# Patient Record
Sex: Female | Born: 1995 | Race: Black or African American | Hispanic: No | Marital: Single | Smoking: Never smoker
Health system: Southern US, Community
[De-identification: ages and names within clinical notes are randomized; demographics above are authoritative.]

## PROBLEM LIST (undated history)

## (undated) ENCOUNTER — Emergency Department (HOSPITAL_BASED_OUTPATIENT_CLINIC_OR_DEPARTMENT_OTHER): Admission: EM | Payer: Medicaid Other | Source: Home / Self Care

---
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
## 2017-09-18 ENCOUNTER — Other Ambulatory Visit: Payer: Self-pay

## 2017-09-18 ENCOUNTER — Emergency Department (HOSPITAL_BASED_OUTPATIENT_CLINIC_OR_DEPARTMENT_OTHER)
Admission: EM | Admit: 2017-09-18 | Discharge: 2017-09-18 | Disposition: A | Discharge status: Home / Self Care | Payer: BLUE CROSS/BLUE SHIELD | Attending: Emergency Medicine | Admitting: Emergency Medicine

## 2017-09-18 ENCOUNTER — Encounter (HOSPITAL_BASED_OUTPATIENT_CLINIC_OR_DEPARTMENT_OTHER): Payer: Self-pay | Admitting: Emergency Medicine

## 2017-09-18 DIAGNOSIS — R300 Dysuria: Secondary | ICD-10-CM | POA: Diagnosis present

## 2017-09-18 DIAGNOSIS — Z79899 Other long term (current) drug therapy: Secondary | ICD-10-CM | POA: Diagnosis not present

## 2017-09-18 DIAGNOSIS — N3001 Acute cystitis with hematuria: Secondary | ICD-10-CM | POA: Diagnosis not present

## 2017-09-18 LAB — URINALYSIS, MICROSCOPIC (REFLEX): RBC / HPF: >50 RBC/hpf (ref 0–5)

## 2017-09-18 LAB — URINALYSIS, ROUTINE W REFLEX MICROSCOPIC
Bilirubin Urine: NEGATIVE
Glucose, UA: NEGATIVE mg/dL
KETONES UR: NEGATIVE mg/dL
Nitrite: POSITIVE — AB
PROTEIN: 100 mg/dL — AB
Specific Gravity, Urine: >1.03 — ABNORMAL HIGH (ref 1.005–1.030)
pH: 5.5 (ref 5.0–8.0)

## 2017-09-18 LAB — PREGNANCY, URINE: PREG TEST UR: NEGATIVE

## 2017-09-18 MED ORDER — CEPHALEXIN 500 MG PO CAPS: 500.0000 mg | ORAL_CAPSULE | Freq: Two times a day (BID) | ORAL | 0 refills | Status: DC

## 2017-09-18 NOTE — ED Notes (Signed)
ED Provider at bedside. 

## 2017-09-18 NOTE — ED Provider Notes (Signed)
[facility] EMERGENCY DEPARTMENT Provider Note   CSN: [id number] Arrival date & time: 09/18/17  2024     History   Chief Complaint Chief Complaint  Patient presents with  . Dysuria    HPI Josalin Radi is a 22 y.o. female.  Patient is a 22 year old female with no significant past medical history who presents with possible UTI.  She has a 2-day history of pressure on urination and urgency.  She denies any abdominal pain.  No nausea or vomiting.  No fevers.  No vaginal bleeding or discharge.  She did notice a little bit of blood on the tissue when she wiped this morning.  She had a history of a UTI in the past about 2 years ago and these symptoms are similar to what she had them.  She has a little achiness in her right lower back but no other back pain.  No flank pain.     History reviewed. No pertinent past medical history.  There are no active problems to display for this patient.   History reviewed. No pertinent surgical history.   OB History   None      Home Medications    Prior to Admission medications   Medication Sig Start Date End Date Taking? Authorizing Provider  amoxicillin (AMOXIL) 500 MG capsule Take 1 capsule (500 mg total) by mouth 3 (three) times daily. 05/22/16   Roxy Horseman, PA-C  cephALEXin (KEFLEX) 500 MG capsule Take 1 capsule (500 mg total) by mouth 2 (two) times daily. 09/18/17   Rolan Bucco, MD    Family History History reviewed. No pertinent family history.  Social History Social History   Tobacco Use  . Smoking status: Never Smoker  . Smokeless tobacco: Never Used  Substance Use Topics  . Alcohol use: No  . Drug use: No     Allergies   Patient has no known allergies.   Review of Systems Review of Systems  Constitutional: Negative for chills, diaphoresis, fatigue and fever.  HENT: Negative for congestion, rhinorrhea and sneezing.   Eyes: Negative.   Respiratory: Negative for cough, chest tightness and shortness  of breath.   Cardiovascular: Negative for chest pain and leg swelling.  Gastrointestinal: Negative for abdominal pain, blood in stool, diarrhea, nausea and vomiting.  Genitourinary: Positive for dysuria, frequency and urgency. Negative for difficulty urinating, flank pain and hematuria.  Musculoskeletal: Negative for arthralgias and back pain.  Skin: Negative for rash.  Neurological: Negative for dizziness, speech difficulty, weakness, numbness and headaches.     Physical Exam Updated Vital Signs BP 122/72 (BP Location: Left Arm)   Pulse 72   Temp 98.4 F (36.9 C) (Oral)   Resp 18   Ht 5\' 5"  (1.651 m)   Wt 84.8 kg (187 lb)   LMP 09/04/2017   SpO2 100%   BMI 31.12 kg/m   Physical Exam  Constitutional: She is oriented to person, place, and time. She appears well-developed and well-nourished.  HENT:  Head: Normocephalic and atraumatic.  Eyes: Pupils are equal, round, and reactive to light.  Neck: Normal range of motion. Neck supple.  Cardiovascular: Normal rate, regular rhythm and normal heart sounds.  Pulmonary/Chest: Effort normal and breath sounds normal. No respiratory distress. She has no wheezes. She has no rales. She exhibits no tenderness.  Abdominal: Soft. Bowel sounds are normal. There is no tenderness. There is no rebound and no guarding.  No CVA tenderness  Musculoskeletal: Normal range of motion. She exhibits no edema.  Lymphadenopathy:  She has no cervical adenopathy.  Neurological: She is alert and oriented to person, place, and time.  Skin: Skin is warm and dry. No rash noted.  Psychiatric: She has a normal mood and affect.     ED Treatments / Results  Labs (all labs ordered are listed, but only abnormal results are displayed) Labs Reviewed  URINALYSIS, ROUTINE W REFLEX MICROSCOPIC - Abnormal; Notable for the following components:      Result Value   APPearance CLOUDY (*)    Specific Gravity, Urine >1.030 (*)    Hgb urine dipstick LARGE (*)     Protein, ur 100 (*)    Nitrite POSITIVE (*)    Leukocytes, UA TRACE (*)    All other components within normal limits  URINALYSIS, MICROSCOPIC (REFLEX) - Abnormal; Notable for the following components:   Bacteria, UA MANY (*)    All other components within normal limits  PREGNANCY, URINE    EKG None  Radiology No results found.  Procedures Procedures (including critical care time)  Medications Ordered in ED Medications - No data to display   Initial Impression / Assessment and Plan / ED Course  I have reviewed the triage vital signs and the nursing notes.  Pertinent labs & imaging results that were available during my care of the patient were reviewed by me and considered in my medical decision making (see chart for details).     Patient is a 22 year old female who presents with urinary symptoms.  She does have evidence of a UTI.  She does not appear systemically ill.  There is no symptoms that we more concerning for pyelonephritis or renal colic.  She was started on Keflex.  She was encouraged to follow-up with her PCP if her symptoms are not improving.  Return precautions were given.  Final Clinical Impressions(s) / ED Diagnoses   Final diagnoses:  Acute cystitis with hematuria    ED Discharge Orders        Ordered    cephALEXin (KEFLEX) 500 MG capsule  2 times daily     09/18/17 2140       Rolan BuccoBelfi, Chalisa Kobler, MD 09/18/17 2141

## 2017-09-18 NOTE — ED Triage Notes (Signed)
Patient states that she is having frequency with urination and pain with urination. She reports that she had blood when she wiped this am

## 2021-09-07 ENCOUNTER — Encounter: Payer: Self-pay | Admitting: Emergency Medicine

## 2021-09-07 ENCOUNTER — Ambulatory Visit
Admission: EM | Admit: 2021-09-07 | Discharge: 2021-09-07 | Disposition: A | Discharge status: Home / Self Care | Payer: BC Managed Care – PPO | Attending: Urgent Care | Admitting: Urgent Care

## 2021-09-07 DIAGNOSIS — B354 Tinea corporis: Secondary | ICD-10-CM | POA: Diagnosis not present

## 2021-09-07 MED ORDER — FLUCONAZOLE 150 MG PO TABS: 150.0000 mg | ORAL_TABLET | Freq: Once | ORAL | 0 refills | Status: AC

## 2021-09-07 MED ORDER — KETOCONAZOLE 2 % EX CREA: 1.0000 | TOPICAL_CREAM | Freq: Every day | CUTANEOUS | 0 refills | Status: DC

## 2021-09-07 NOTE — ED Triage Notes (Signed)
Pt here with rash to neck and chest x 1 month. Has tried OTC creams without relief. Itches when dry. Is intermittently tender to touch.

## 2021-09-07 NOTE — ED Provider Notes (Signed)
  [facility]   MRN: [id number] DOB: 03-Jun-1995  Subjective:   Alexis Copeland is a 26 y.o. female presenting for 1 month history of persistent itchy rash over the neck and chest.  Has used over-the-counter creams without much relief.  No drainage of pus or bleeding.  Patient has scratched and she has noticed some breaks of the skin that are painful. Denies eating any new foods, starting new medications, exposure to poisonous plants, new hygiene products, new cleaning products or detergents.   No current facility-administered medications for this encounter.  Current Outpatient Medications:    fluconazole (DIFLUCAN) 150 MG tablet, Take 1 tablet (150 mg total) by mouth once for 1 dose., Disp: 1 tablet, Rfl: 0   ketoconazole (NIZORAL) 2 % cream, Apply 1 Application topically daily., Disp: 60 g, Rfl: 0   No Known Allergies  History reviewed. No pertinent past medical history.   History reviewed. No pertinent surgical history.  History reviewed. No pertinent family history.  Social History   Tobacco Use   Smoking status: Never   Smokeless tobacco: Never  Substance Use Topics   Alcohol use: No   Drug use: No    ROS   Objective:   Vitals: BP 114/73   Pulse 73   Temp 98.9 F (37.2 C)   Resp 20   SpO2 99%   Physical Exam Constitutional:      General: She is not in acute distress.    Appearance: Normal appearance. She is well-developed. She is not ill-appearing, toxic-appearing or diaphoretic.  HENT:     Head: Normocephalic and atraumatic.     Nose: Nose normal.     Mouth/Throat:     Mouth: Mucous membranes are moist.  Eyes:     General: No scleral icterus.       Right eye: No discharge.        Left eye: No discharge.     Extraocular Movements: Extraocular movements intact.  Cardiovascular:     Rate and Rhythm: Normal rate.  Pulmonary:     Effort: Pulmonary effort is normal.  Skin:    General: Skin is warm and dry.     Findings: Rash (dry  scaly irregular shaped patches with central clearing) present.  Neurological:     General: No focal deficit present.     Mental Status: She is alert and oriented to person, place, and time.  Psychiatric:        Mood and Affect: Mood normal.        Behavior: Behavior normal.            Assessment and Plan :   PDMP not reviewed this encounter.  1. Tinea corporis    Start oral fluconazole as a loading dose.  Then used ketoconazole cream to manage tinea corporis. Counseled patient on potential for adverse effects with medications prescribed/recommended today, ER and return-to-clinic precautions discussed, patient verbalized understanding.    Wallis Bamberg, PA-C 09/07/21 1433

## 2021-10-11 ENCOUNTER — Ambulatory Visit
Admission: EM | Admit: 2021-10-11 | Discharge: 2021-10-11 | Disposition: A | Discharge status: Home / Self Care | Payer: BC Managed Care – PPO | Attending: Family Medicine | Admitting: Family Medicine

## 2021-10-11 DIAGNOSIS — N760 Acute vaginitis: Secondary | ICD-10-CM | POA: Diagnosis present

## 2021-10-11 DIAGNOSIS — A5602 Chlamydial vulvovaginitis: Secondary | ICD-10-CM | POA: Insufficient documentation

## 2021-10-11 MED ORDER — METRONIDAZOLE 500 MG PO TABS: 500.0000 mg | ORAL_TABLET | Freq: Two times a day (BID) | ORAL | 0 refills | Status: DC

## 2021-10-11 NOTE — ED Provider Notes (Signed)
[facility]    CSN: [id number] Arrival date & time: 10/11/21  1504      History   Chief Complaint Chief Complaint  Patient presents with   SEXUALLY TRANSMITTED DISEASE    Believe I may have BV Or trich - Entered by patient    HPI Alexis Copeland is a 26 y.o. female.   Presenting today with 2-week history of white/gray vaginal discharge, fishy odor.  She denies rashes or lesions, pelvic or abdominal pain, nausea, vomiting, diarrhea, dysuria, hematuria.  States she has had BV before and that this feels similar.  No known exposures to any STIs.  Not concern for pregnancy, states LMP was about 3 weeks ago.  Not trying anything over-the-counter for symptoms.    History reviewed. No pertinent past medical history.  There are no problems to display for this patient.   History reviewed. No pertinent surgical history.  OB History   No obstetric history on file.      Home Medications    Prior to Admission medications   Medication Sig Start Date End Date Taking? Authorizing Provider  metroNIDAZOLE (FLAGYL) 500 MG tablet Take 1 tablet (500 mg total) by mouth 2 (two) times daily. 10/11/21  Yes Particia Nearing, PA-C  ketoconazole (NIZORAL) 2 % cream Apply 1 Application topically daily. 09/07/21   Wallis Bamberg, PA-C    Family History History reviewed. No pertinent family history.  Social History Social History   Tobacco Use   Smoking status: Never   Smokeless tobacco: Never  Substance Use Topics   Alcohol use: No   Drug use: No     Allergies   Patient has no known allergies.   Review of Systems Review of Systems Per HPI  Physical Exam Triage Vital Signs ED Triage Vitals [10/11/21 1517]  Enc Vitals Group     BP 124/85     Pulse Rate 96     Resp      Temp      Temp Source Oral     SpO2 98 %     Weight      Height      Head Circumference      Peak Flow      Pain Score      Pain Loc      Pain Edu?      Excl. in GC?    No data  found.  Updated Vital Signs BP 124/85 (BP Location: Left Arm)   Pulse 96   SpO2 98%   Visual Acuity Right Eye Distance:   Left Eye Distance:   Bilateral Distance:    Right Eye Near:   Left Eye Near:    Bilateral Near:     Physical Exam Vitals and nursing note reviewed.  Constitutional:      Appearance: Normal appearance. She is not ill-appearing.  HENT:     Head: Atraumatic.  Eyes:     Extraocular Movements: Extraocular movements intact.     Conjunctiva/sclera: Conjunctivae normal.  Cardiovascular:     Rate and Rhythm: Normal rate and regular rhythm.     Heart sounds: Normal heart sounds.  Pulmonary:     Effort: Pulmonary effort is normal.     Breath sounds: Normal breath sounds.  Genitourinary:    Comments: GU exam deferred, self swab performed Musculoskeletal:        General: Normal range of motion.     Cervical back: Normal range of motion and neck supple.  Skin:  General: Skin is warm and dry.  Neurological:     Mental Status: She is alert and oriented to person, place, and time.  Psychiatric:        Mood and Affect: Mood normal.        Thought Content: Thought content normal.        Judgment: Judgment normal.      UC Treatments / Results  Labs (all labs ordered are listed, but only abnormal results are displayed) Labs Reviewed  CERVICOVAGINAL ANCILLARY ONLY    EKG   Radiology No results found.  Procedures Procedures (including critical care time)  Medications Ordered in UC Medications - No data to display  Initial Impression / Assessment and Plan / UC Course  I have reviewed the triage vital signs and the nursing notes.  Pertinent labs & imaging results that were available during my care of the patient were reviewed by me and considered in my medical decision making (see chart for details).     Vaginal swab pending, given symptoms and history of BV will cover with metronidazole while awaiting these results.  Boric acid suppositories,  good vaginal hygiene reviewed.  Adjust treatment based on results.  Final Clinical Impressions(s) / UC Diagnoses   Final diagnoses:  Acute vaginitis   Discharge Instructions   None    ED Prescriptions     Medication Sig Dispense Auth. Provider   metroNIDAZOLE (FLAGYL) 500 MG tablet Take 1 tablet (500 mg total) by mouth 2 (two) times daily. 14 tablet Particia Nearing, [place]      PDMP not reviewed this encounter.   Particia Nearing, [place] 10/11/21 1544

## 2021-10-11 NOTE — ED Notes (Signed)
Std Testing, Vaginal Discharge that's discolored, Odor, Urinary Frequency  x 2 weeks   Pain 0/10   LMP-09/12/21 

## 2021-10-11 NOTE — ED Triage Notes (Signed)
Std Testing, Vaginal Discharge that's discolored, Odor, Urinary Frequency  x 2 weeks   Pain 0/10   LMP-09/12/21

## 2021-10-13 ENCOUNTER — Telehealth (HOSPITAL_COMMUNITY): Payer: Self-pay | Admitting: Emergency Medicine

## 2021-10-13 LAB — CERVICOVAGINAL ANCILLARY ONLY
Bacterial Vaginitis (gardnerella): POSITIVE — AB
Candida Glabrata: NEGATIVE
Candida Vaginitis: NEGATIVE
Chlamydia: POSITIVE — AB
Comment: NEGATIVE
Comment: NEGATIVE
Comment: NEGATIVE
Comment: NEGATIVE
Comment: NEGATIVE
Comment: NORMAL
Neisseria Gonorrhea: NEGATIVE
Trichomonas: NEGATIVE

## 2021-10-13 MED ORDER — DOXYCYCLINE HYCLATE 100 MG PO CAPS: 100.0000 mg | ORAL_CAPSULE | Freq: Two times a day (BID) | ORAL | 0 refills | Status: AC

## 2021-11-11 ENCOUNTER — Ambulatory Visit
Admission: EM | Admit: 2021-11-11 | Discharge: 2021-11-11 | Disposition: A | Discharge status: Home / Self Care | Payer: BC Managed Care – PPO | Attending: Urgent Care | Admitting: Urgent Care

## 2021-11-11 ENCOUNTER — Encounter: Payer: Self-pay | Admitting: Emergency Medicine

## 2021-11-11 DIAGNOSIS — R070 Pain in throat: Secondary | ICD-10-CM | POA: Diagnosis not present

## 2021-11-11 DIAGNOSIS — R0982 Postnasal drip: Secondary | ICD-10-CM

## 2021-11-11 DIAGNOSIS — H9203 Otalgia, bilateral: Secondary | ICD-10-CM | POA: Diagnosis not present

## 2021-11-11 DIAGNOSIS — J069 Acute upper respiratory infection, unspecified: Secondary | ICD-10-CM

## 2021-11-11 MED ORDER — PROMETHAZINE-DM 6.25-15 MG/5ML PO SYRP: 2.5000 mL | ORAL_SOLUTION | Freq: Every evening | ORAL | 0 refills | Status: DC | PRN

## 2021-11-11 MED ORDER — LEVOCETIRIZINE DIHYDROCHLORIDE 5 MG PO TABS: 5.0000 mg | ORAL_TABLET | Freq: Every evening | ORAL | 0 refills | Status: DC

## 2021-11-11 MED ORDER — PSEUDOEPHEDRINE HCL 60 MG PO TABS: 60.0000 mg | ORAL_TABLET | Freq: Three times a day (TID) | ORAL | 0 refills | Status: DC | PRN

## 2021-11-11 NOTE — Discharge Instructions (Addendum)
We will manage this as a viral illness. For sore throat or cough try using a honey-based tea. Use 3 teaspoons of honey with juice squeezed from half lemon. Place shaved pieces of ginger into 1/2-1 cup of water and warm over stove top. Then mix the ingredients and repeat every 4 hours as needed. Please take ibuprofen 600mg  every 6 hours with food alternating with OR taken together with Tylenol 500mg -650mg  every 6 hours for throat pain, fevers, aches and pains. Hydrate very well with at least 2 liters of water. Eat light meals such as soups (chicken and noodles, vegetable, chicken and wild rice).  Do not eat foods that you are allergic to.  Taking an antihistamine like Xyzal can help against postnasal drainage, sinus congestion which can cause sinus pain, sinus headaches, throat pain, painful swallowing, coughing.  You can take this together with pseudoephedrine (Sudafed) at a dose of 30-60 mg 3 times a day or twice daily as needed for the same kind of nasal drip, congestion.  Use cough medications as needed.

## 2021-11-11 NOTE — ED Triage Notes (Signed)
Pt here with bodyaches, throat pain, and otalgia x 4 days.

## 2021-11-11 NOTE — ED Provider Notes (Signed)
[facility]   MRN: [id number] DOB: [birth year]  Subjective:   Alexis Copeland is a 26 y.o. female presenting for 4 day history of bilateral ear fullness and pain, left worse than right, throat pain, body aches, slight cough.  No chest pain, shortness of breath or wheezing.  Patient did 2 COVID test at home and both were negative.  Has had some sick contacts at work, no specifics just knows that a lot of people are coughing.  No history of respiratory disorders.  No drug use, alcohol use.  Patient is not a smoker.  No current facility-administered medications for this encounter.  Current Outpatient Medications:    ketoconazole (NIZORAL) 2 % cream, Apply 1 Application topically daily., Disp: 60 g, Rfl: 0   metroNIDAZOLE (FLAGYL) 500 MG tablet, Take 1 tablet (500 mg total) by mouth 2 (two) times daily., Disp: 14 tablet, Rfl: 0   No Known Allergies  History reviewed. No pertinent past medical history.   History reviewed. No pertinent surgical history.  History reviewed. No pertinent family history.  Social History   Tobacco Use   Smoking status: Never   Smokeless tobacco: Never  Substance Use Topics   Alcohol use: No   Drug use: No    ROS   Objective:   Vitals: BP 112/79   Pulse 97   Temp 99.6 F (37.6 C) (Oral)   Resp 20   SpO2 98%   Physical Exam Constitutional:      General: She is not in acute distress.    Appearance: Normal appearance. She is well-developed and normal weight. She is not ill-appearing, toxic-appearing or diaphoretic.  HENT:     Head: Normocephalic and atraumatic.     Right Ear: Tympanic membrane, ear canal and external ear normal. No drainage, swelling or tenderness. No middle ear effusion. There is no impacted cerumen. Tympanic membrane is not erythematous.     Left Ear: Tympanic membrane, ear canal and external ear normal. No drainage, swelling or tenderness.  No middle ear effusion. There is no impacted cerumen. Tympanic  membrane is not erythematous.     Nose: Nose normal. No congestion or rhinorrhea.     Mouth/Throat:     Mouth: Mucous membranes are moist. No oral lesions.     Pharynx: No pharyngeal swelling, oropharyngeal exudate, posterior oropharyngeal erythema or uvula swelling.     Tonsils: No tonsillar exudate or tonsillar abscesses. 0 on the right. 0 on the left.  Eyes:     General: No scleral icterus.       Right eye: No discharge.        Left eye: No discharge.     Extraocular Movements: Extraocular movements intact.     Right eye: Normal extraocular motion.     Left eye: Normal extraocular motion.     Conjunctiva/sclera: Conjunctivae normal.  Cardiovascular:     Rate and Rhythm: Normal rate and regular rhythm.     Heart sounds: Normal heart sounds. No murmur heard.    No friction rub. No gallop.  Pulmonary:     Effort: Pulmonary effort is normal. No respiratory distress.     Breath sounds: No stridor. No wheezing, rhonchi or rales.  Chest:     Chest wall: No tenderness.  Musculoskeletal:     Cervical back: Normal range of motion and neck supple.  Lymphadenopathy:     Cervical: No cervical adenopathy.  Skin:    General: Skin is warm and dry.  Neurological:  General: No focal deficit present.     Mental Status: She is alert and oriented to person, place, and time.  Psychiatric:        Mood and Affect: Mood normal.        Behavior: Behavior normal.     Assessment and Plan :   PDMP not reviewed this encounter.  1. Viral upper respiratory infection   2. Throat pain   3. Ear pain, bilateral   4. Post-nasal drainage    Deferred imaging given clear cardiopulmonary exam, hemodynamically stable vital signs. Does not meet [facility] criteria for strep testing.  Suspect viral URI, viral syndrome. Physical exam findings reassuring and vital signs stable for discharge. Advised supportive care, offered symptomatic relief. Counseled patient on potential for adverse effects with medications  prescribed/recommended today, ER and return-to-clinic precautions discussed, patient verbalized understanding.     Wallis Bamberg, PA-C 11/11/21 1235

## 2021-11-19 ENCOUNTER — Ambulatory Visit
Admission: EM | Admit: 2021-11-19 | Discharge: 2021-11-19 | Disposition: A | Discharge status: Home / Self Care | Payer: BC Managed Care – PPO | Attending: Emergency Medicine | Admitting: Emergency Medicine

## 2021-11-19 DIAGNOSIS — J309 Allergic rhinitis, unspecified: Secondary | ICD-10-CM

## 2021-11-19 MED ORDER — LEVOCETIRIZINE DIHYDROCHLORIDE 5 MG PO TABS: 5.0000 mg | ORAL_TABLET | Freq: Every evening | ORAL | 1 refills | Status: AC

## 2021-11-19 MED ORDER — FLUTICASONE PROPIONATE 50 MCG/ACT NA SUSP: 1.0000 | Freq: Every day | NASAL | 2 refills | Status: AC

## 2021-11-19 NOTE — ED Triage Notes (Signed)
The patient c/o sore throat and left eat pain that has not gotten better since last visit. The patient states she has been taking motrin.

## 2021-11-19 NOTE — ED Provider Notes (Signed)
[facility]    CSN: [id number] Arrival date & time: 11/19/21  1928    HISTORY   Chief Complaint  Patient presents with   Otalgia   Sore Throat   HPI Alexis Copeland is a pleasant, 26 y.o. female who presents to urgent care today. The patient c/o sore throat and left eat pain that has not gotten better since last visit. The patient states she has been taking Xyzal and motrin without meaningful relief of her symptoms.  Patient states that the Sudafed and Promethazine DM she was prescribed have not been helpful at all, states she when she was seen last week here at urgent care she was not coughing, is not sure why she was given cough syrup.  The history is provided by the patient.   History reviewed. No pertinent past medical history. There are no problems to display for this patient.  History reviewed. No pertinent surgical history. OB History   No obstetric history on file.    Home Medications    Prior to Admission medications   Medication Sig Start Date End Date Taking? Authorizing Provider  levocetirizine (XYZAL) 5 MG tablet Take 1 tablet (5 mg total) by mouth every evening. 11/11/21   Wallis Bamberg, PA-C    Family History History reviewed. No pertinent family history. Social History Social History   Tobacco Use   Smoking status: Never   Smokeless tobacco: Never  Substance Use Topics   Alcohol use: No   Drug use: No   Allergies   Patient has no known allergies.  Review of Systems Review of Systems Pertinent findings revealed after performing a 14 point review of systems has been noted in the history of present illness.  Physical Exam Triage Vital Signs ED Triage Vitals  Enc Vitals Group     BP 01/16/21 0827 (!) 147/82     Pulse Rate 01/16/21 0827 72     Resp 01/16/21 0827 18     Temp 01/16/21 0827 98.3 F (36.8 C)     Temp Source 01/16/21 0827 Oral     SpO2 01/16/21 0827 98 %     Weight --      Height --      Head Circumference --      Peak  Flow --      Pain Score 01/16/21 0826 5     Pain Loc --      Pain Edu? --      Excl. in [place]? --   No data found.  Updated Vital Signs BP 123/87 (BP Location: Right Arm)   Pulse 86   Temp 98.6 F (37 C) (Oral)   Resp 16   LMP 11/11/2021 (Approximate)   SpO2 98%   Physical Exam HENT:     Right Ear: External ear normal. A middle ear effusion is present. Tympanic membrane is bulging. Tympanic membrane is not injected or erythematous.     Left Ear: External ear normal. A middle ear effusion is present. Tympanic membrane is bulging. Tympanic membrane is not injected or erythematous.     Ears:     Comments: Bilateral EACs normal, both TMs bulging with clear fluid    Nose: Rhinorrhea present. No nasal deformity, septal deviation, signs of injury or nasal tenderness. Rhinorrhea is clear.     Right Nostril: Occlusion present. No foreign body, epistaxis or septal hematoma.     Left Nostril: Occlusion present. No foreign body, epistaxis or septal hematoma.     Right Turbinates: Enlarged, swollen  and pale.     Left Turbinates: Enlarged, swollen and pale.     Right Sinus: No maxillary sinus tenderness or frontal sinus tenderness.     Left Sinus: No maxillary sinus tenderness or frontal sinus tenderness.     Mouth/Throat:     Comments: Postnasal drip    Visual Acuity Right Eye Distance:   Left Eye Distance:   Bilateral Distance:    Right Eye Near:   Left Eye Near:    Bilateral Near:     UC Couse / Diagnostics / Procedures:     Radiology No results found.  Procedures Procedures (including critical care time) EKG  Pending results:  Labs Reviewed - No data to display  Medications Ordered in UC: Medications - No data to display  UC Diagnoses / Final Clinical Impressions(s)   I have reviewed the triage vital signs and the nursing notes.  Pertinent labs & imaging results that were available during my care of the patient were reviewed by me and considered in my medical decision  making (see chart for details).    Final diagnoses:  Allergic rhinitis, unspecified seasonality, unspecified trigger   Physical exam findings not concerning for viral or bacterial infection.  Patient advised to continue Xyzal, refills provided, and to begin Flonase for better allergy control.  Return precautions advised.  ED Prescriptions     Medication Sig Dispense Auth. Provider   levocetirizine (XYZAL) 5 MG tablet Take 1 tablet (5 mg total) by mouth every evening. 90 tablet Theadora Rama Scales, PA-C   fluticasone (FLONASE) 50 MCG/ACT nasal spray Place 1 spray into both nostrils daily. Begin by using 2 sprays in each nare daily for 3 to 5 days, then decrease to 1 spray in each nare daily. 15.8 mL Theadora Rama Scales, PA-C      PDMP not reviewed this encounter.  Disposition Upon Discharge:  Condition: stable for discharge home Home: take medications as prescribed; routine discharge instructions as discussed; follow up as advised.  Patient presented with an acute illness with associated systemic symptoms and significant discomfort requiring urgent management. In my opinion, this is a condition that a prudent lay person (someone who possesses an average knowledge of health and medicine) may potentially expect to result in complications if not addressed urgently such as respiratory distress, impairment of bodily function or dysfunction of bodily organs.   Routine symptom specific, illness specific and/or disease specific instructions were discussed with the patient and/or caregiver at length.   As such, the patient has been evaluated and assessed, work-up was performed and treatment was provided in alignment with urgent care protocols and evidence based medicine.  Patient/parent/caregiver has been advised that the patient may require follow up for further testing and treatment if the symptoms continue in spite of treatment, as clinically indicated and appropriate.  If the patient was  tested for COVID-19, Influenza and/or RSV, then the patient/parent/guardian was advised to isolate at home pending the results of his/her diagnostic coronavirus test and potentially longer if they're positive. I have also advised pt that if his/her COVID-19 test returns positive, it's recommended to self-isolate for at least 10 days after symptoms first appeared AND until fever-free for 24 hours without fever reducer AND other symptoms have improved or resolved. Discussed self-isolation recommendations as well as instructions for household member/close contacts as per the [facility] and [place] DHHS, and also gave patient the COVID packet with this information.  Patient/parent/caregiver has been advised to return to the [facility] or PCP in 3-5  days if no better; to PCP or the Emergency Department if new signs and symptoms develop, or if the current signs or symptoms continue to change or worsen for further workup, evaluation and treatment as clinically indicated and appropriate  The patient will follow up with their current PCP if and as advised. If the patient does not currently have a PCP we will assist them in obtaining one.   The patient may need specialty follow up if the symptoms continue, in spite of conservative treatment and management, for further workup, evaluation, consultation and treatment as clinically indicated and appropriate.  Patient/parent/caregiver verbalized understanding and agreement of plan as discussed.  All questions were addressed during visit.  Please see discharge instructions below for further details of plan.  Discharge Instructions:   Discharge Instructions      Your symptoms and my physical exam findings are concerning for exacerbation of your underlying allergies.     Please see the list below for recommended medications, dosages and frequencies to provide relief of current symptoms:     Xyzal (levocetirizine): This is an excellent second-generation antihistamine that helps to  reduce respiratory inflammatory response to environmental allergens.  In some patients, this medication can cause daytime sleepiness so I recommend that you take 1 tablet daily at bedtime.     Flonase (fluticasone): This is a steroid nasal spray that you use once daily, 1 spray in each nare.  This medication does not work well if you decide to use it only used as you feel you need to, it works best used on a daily basis.  After 3 to 5 days of use, you will notice significant reduction of the inflammation and mucus production that is currently being caused by exposure to allergens, whether seasonal or environmental.  The most common side effect of this medication is nosebleeds.  If you experience a nosebleed, please discontinue use for 1 week, then feel free to resume.  I have provided you with a prescription.     If your insurance will not cover your allergy medications, please consider downloading the Good Rx app which is free.  You can find considerable discounts on prescription and over-the-counter medications.   If you find that you have not had improvement of your symptoms in the next 5 to 7 days, please follow-up with your primary care provider or return here to urgent care for repeat evaluation and further recommendations.   Thank you for visiting urgent care today.  We appreciate the opportunity to participate in your care.       This office note has been dictated using Museum/gallery curator.  Unfortunately, this method of dictation can sometimes lead to typographical or grammatical errors.  I apologize for your inconvenience in advance if this occurs.  Please do not hesitate to reach out to me if clarification is needed.      Lynden Oxford Scales, PA-C 11/21/21 1723

## 2021-11-19 NOTE — Discharge Instructions (Signed)
Your symptoms and my physical exam findings are concerning for exacerbation of your underlying allergies.     Please see the list below for recommended medications, dosages and frequencies to provide relief of current symptoms:     Xyzal (levocetirizine): This is an excellent second-generation antihistamine that helps to reduce respiratory inflammatory response to environmental allergens.  In some patients, this medication can cause daytime sleepiness so I recommend that you take 1 tablet daily at bedtime.     Flonase (fluticasone): This is a steroid nasal spray that you use once daily, 1 spray in each nare.  This medication does not work well if you decide to use it only used as you feel you need to, it works best used on a daily basis.  After 3 to 5 days of use, you will notice significant reduction of the inflammation and mucus production that is currently being caused by exposure to allergens, whether seasonal or environmental.  The most common side effect of this medication is nosebleeds.  If you experience a nosebleed, please discontinue use for 1 week, then feel free to resume.  I have provided you with a prescription.     If your insurance will not cover your allergy medications, please consider downloading the Good Rx app which is free.  You can find considerable discounts on prescription and over-the-counter medications.   If you find that you have not had improvement of your symptoms in the next 5 to 7 days, please follow-up with your primary care provider or return here to urgent care for repeat evaluation and further recommendations.   Thank you for visiting urgent care today.  We appreciate the opportunity to participate in your care.

## 2022-04-02 ENCOUNTER — Ambulatory Visit
Admission: EM | Admit: 2022-04-02 | Discharge: 2022-04-02 | Disposition: A | Discharge status: Home / Self Care | Payer: No Typology Code available for payment source | Attending: Emergency Medicine | Admitting: Emergency Medicine

## 2022-04-02 DIAGNOSIS — N760 Acute vaginitis: Secondary | ICD-10-CM | POA: Diagnosis not present

## 2022-04-02 DIAGNOSIS — R69 Illness, unspecified: Secondary | ICD-10-CM | POA: Diagnosis not present

## 2022-04-02 DIAGNOSIS — Z113 Encounter for screening for infections with a predominantly sexual mode of transmission: Secondary | ICD-10-CM | POA: Diagnosis not present

## 2022-04-02 LAB — POCT URINALYSIS DIP (MANUAL ENTRY)
Bilirubin, UA: NEGATIVE
Blood, UA: NEGATIVE
Glucose, UA: NEGATIVE mg/dL
Ketones, POC UA: NEGATIVE mg/dL
Leukocytes, UA: NEGATIVE
Nitrite, UA: NEGATIVE
Protein Ur, POC: NEGATIVE mg/dL
Spec Grav, UA: >=1.03 — AB (ref 1.010–1.025)
Urobilinogen, UA: 0.2 E.U./dL
pH, UA: 6 (ref 5.0–8.0)

## 2022-04-02 LAB — POCT URINE PREGNANCY: Preg Test, Ur: NEGATIVE

## 2022-04-02 MED ORDER — CLOTRIMAZOLE 1 % VA CREA: TOPICAL_CREAM | VAGINAL | 0 refills | Status: AC

## 2022-04-02 NOTE — Discharge Instructions (Signed)
The results of your vaginal swab test which screens for BV, yeast, gonorrhea, chlamydia and trichomonas will be made posted to your MyChart account once it is complete.  This typically takes 2 to 4 days.  Please abstain from sexual intercourse of any kind, vaginal, oral or anal, until you have received the results of your STD testing.     If any of your results are abnormal, you will receive a phone call regarding treatment.  Prescriptions, if any are needed, will be provided for you at your pharmacy.     For comfort, while you are waiting for the result of your vaginal swab test, I have provided you with an external yeast infection cream that you can apply 3-4 times daily for relief of vaginal itching and burning.  I recommend that you abstain from sexual intercourse, tampon use or any other other intravaginal activities while you are waiting on these results and possible treatment.   Your urine pregnancy test today is negative.   Our point-of-care analysis of your urine sample today was normal and did not reveal any concern for urinary tract infection.   If you have not had complete resolution of your symptoms after completing any recommended treatment or if your symptoms worsen, please return for repeat evaluation.   Thank you for visiting urgent care today.  I appreciate the opportunity to participate in your care.

## 2022-04-02 NOTE — ED Provider Notes (Addendum)
[facility]    CSN: [id number] Arrival date & time: 04/02/22  1534    HISTORY   Chief Complaint  Patient presents with   Vaginal Itching   HPI Alexis Copeland is a pleasant, 27 y.o. female who presents to urgent care today. Patient complains of vaginal itching that began about 2 weeks ago.  Patient states has not tried anything to alleviate her symptoms.  Patient endorses vaginal itching and vaginal irritation.  Patient denies abnormal odor of urine, increased frequency of urination, suprapubic pain, perineal pain, incontinence of urine, flank pain, fever, chills, malaise, rigors, significant fatigue, abnormal vaginal discharge, genital lesion(s), known exposure to STD, and possible exposure to STD.     The history is provided by the patient.   History reviewed. No pertinent past medical history. There are no problems to display for this patient.  History reviewed. No pertinent surgical history. OB History   No obstetric history on file.    Home Medications    Prior to Admission medications   Medication Sig Start Date End Date Taking? Authorizing Provider  fluticasone (FLONASE) 50 MCG/ACT nasal spray Place 1 spray into both nostrils daily. Begin by using 2 sprays in each nare daily for 3 to 5 days, then decrease to 1 spray in each nare daily. 11/19/21   Lynden Oxford Scales, PA-C  levocetirizine (XYZAL) 5 MG tablet Take 1 tablet (5 mg total) by mouth every evening. 11/19/21 05/18/22  Lynden Oxford Scales, PA-C    Family History History reviewed. No pertinent family history. Social History Social History   Tobacco Use   Smoking status: Never   Smokeless tobacco: Never  Vaping Use   Vaping Use: Never used  Substance Use Topics   Alcohol use: No   Drug use: No   Allergies   Patient has no known allergies.  Review of Systems Review of Systems Pertinent findings revealed after performing a 14 point review of systems has been noted in the history of present  illness.  Physical Exam Triage Vital Signs ED Triage Vitals  Enc Vitals Group     BP 01/16/21 0827 (!) 147/82     Pulse Rate 01/16/21 0827 72     Resp 01/16/21 0827 18     Temp 01/16/21 0827 98.3 F (36.8 C)     Temp Source 01/16/21 0827 Oral     SpO2 01/16/21 0827 98 %     Weight --      Height --      Head Circumference --      Peak Flow --      Pain Score 01/16/21 0826 5     Pain Loc --      Pain Edu? --      Excl. in [place]? --   No data found.  Updated Vital Signs BP 125/85 (BP Location: Left Arm)   Pulse 86   Temp 99.3 F (37.4 C) (Oral)   Resp 16   LMP 03/17/2022   SpO2 98%   Physical Exam Vitals and nursing note reviewed.  Constitutional:      General: She is not in acute distress.    Appearance: Normal appearance. She is not ill-appearing.  HENT:     Head: Normocephalic and atraumatic.  Eyes:     General: Lids are normal.        Right eye: No discharge.        Left eye: No discharge.     Extraocular Movements: Extraocular movements intact.  Conjunctiva/sclera: Conjunctivae normal.     Right eye: Right conjunctiva is not injected.     Left eye: Left conjunctiva is not injected.  Neck:     Trachea: Trachea and phonation normal.  Cardiovascular:     Rate and Rhythm: Normal rate and regular rhythm.     Pulses: Normal pulses.     Heart sounds: Normal heart sounds. No murmur heard.    No friction rub. No gallop.  Pulmonary:     Effort: Pulmonary effort is normal. No accessory muscle usage, prolonged expiration or respiratory distress.     Breath sounds: Normal breath sounds. No stridor, decreased air movement or transmitted upper airway sounds. No decreased breath sounds, wheezing, rhonchi or rales.  Chest:     Chest wall: No tenderness.  Genitourinary:    Comments: Patient politely declines pelvic exam today, patient provided a vaginal swab for testing. Musculoskeletal:        General: Normal range of motion.     Cervical back: Normal range of motion  and neck supple. Normal range of motion.  Lymphadenopathy:     Cervical: No cervical adenopathy.  Skin:    General: Skin is warm and dry.     Findings: No erythema or rash.  Neurological:     General: No focal deficit present.     Mental Status: She is alert and oriented to person, place, and time.  Psychiatric:        Mood and Affect: Mood normal.        Behavior: Behavior normal.     Visual Acuity Right Eye Distance:   Left Eye Distance:   Bilateral Distance:    Right Eye Near:   Left Eye Near:    Bilateral Near:     UC Couse / Diagnostics / Procedures:     Radiology No results found.  Procedures Procedures (including critical care time) EKG  Pending results:  Labs Reviewed  POCT URINALYSIS DIP (MANUAL ENTRY) - Abnormal; Notable for the following components:      Result Value   Spec Grav, UA >=1.030 (*)    All other components within normal limits  POCT URINE PREGNANCY  CERVICOVAGINAL ANCILLARY ONLY    Medications Ordered in UC: Medications - No data to display  UC Diagnoses / Final Clinical Impressions(s)   I have reviewed the triage vital signs and the nursing notes.  Pertinent labs & imaging results that were available during my care of the patient were reviewed by me and considered in my medical decision making (see chart for details).    Final diagnoses:  Vaginitis and vulvovaginitis  Screening examination for STD (sexually transmitted disease)   Patient was provided with Clotrimazole cream applied 3-4 x daily as needed for treatment of vulvovaginal irritation based on the history provided to me today.   STD screening was performed, patient advised that the results be posted to their MyChart and if any of the results are positive, they will be notified by phone, further treatment will be provided as indicated based on results of STD screening. Patient was advised to abstain from sexual intercourse until that they receive the results of their STD  testing.  Patient was also advised to use condoms to protect themselves from STD exposure. Urinalysis today was normal. Urine pregnancy test was negative. Return precautions advised.  Drug allergies reviewed, all questions addressed.   Please see discharge instructions below for details of plan of care as provided to patient. ED Prescriptions  Medication Sig Dispense Auth. Provider   clotrimazole (GYNE-LOTRIMIN) 1 % vaginal cream Apply to external vaginal area 3-4 times daily as needed for vulvovaginitis. 45 g Lynden Oxford Scales, PA-C      PDMP not reviewed this encounter.  Disposition Upon Discharge:  Condition: stable for discharge home  Patient presented with concern for an acute illness with associated systemic symptoms and significant discomfort requiring urgent management. In my opinion, this is a condition that a prudent lay person (someone who possesses an average knowledge of health and medicine) may potentially expect to result in complications if not addressed urgently such as respiratory distress, impairment of bodily function or dysfunction of bodily organs.   As such, the patient has been evaluated and assessed, work-up was performed and treatment was provided in alignment with urgent care protocols and evidence based medicine.  Patient/parent/caregiver has been advised that the patient may require follow up for further testing and/or treatment if the symptoms continue in spite of treatment, as clinically indicated and appropriate.  Routine symptom specific, illness specific and/or disease specific instructions were discussed with the patient and/or caregiver at length.  Prevention strategies for avoiding STD exposure were also discussed.  The patient will follow up with their current PCP if and as advised. If the patient does not currently have a PCP we will assist them in obtaining one.   The patient may need specialty follow up if the symptoms continue, in spite of  conservative treatment and management, for further workup, evaluation, consultation and treatment as clinically indicated and appropriate.  Patient/parent/caregiver verbalized understanding and agreement of plan as discussed.  All questions were addressed during visit.  Please see discharge instructions below for further details of plan.  Discharge Instructions:   Discharge Instructions      The results of your vaginal swab test which screens for BV, yeast, gonorrhea, chlamydia and trichomonas will be made posted to your MyChart account once it is complete.  This typically takes 2 to 4 days.  Please abstain from sexual intercourse of any kind, vaginal, oral or anal, until you have received the results of your STD testing.     If any of your results are abnormal, you will receive a phone call regarding treatment.  Prescriptions, if any are needed, will be provided for you at your pharmacy.     For comfort, while you are waiting for the result of your vaginal swab test, I have provided you with an external yeast infection cream that you can apply 3-4 times daily for relief of vaginal itching and burning.  I recommend that you abstain from sexual intercourse, tampon use or any other other intravaginal activities while you are waiting on these results and possible treatment.   Your urine pregnancy test today is negative.   Our point-of-care analysis of your urine sample today was normal and did not reveal any concern for urinary tract infection.   If you have not had complete resolution of your symptoms after completing any recommended treatment or if your symptoms worsen, please return for repeat evaluation.   Thank you for visiting urgent care today.  I appreciate the opportunity to participate in your care.       This office note has been dictated using Museum/gallery curator.  Unfortunately, this method of dictation can sometimes lead to typographical or grammatical errors.   I apologize for your inconvenience in advance if this occurs.  Please do not hesitate to reach out to me if clarification is  needed.       Theadora Rama Scales, PA-C 04/02/22 1656    Theadora Rama Scales, PA-C 04/02/22 [phone]

## 2022-04-02 NOTE — ED Triage Notes (Signed)
Pt reports having vaginal itching.   Started: 2 weeks ago   Home interventions: none

## 2022-04-05 LAB — CERVICOVAGINAL ANCILLARY ONLY
Bacterial Vaginitis (gardnerella): NEGATIVE
Candida Glabrata: NEGATIVE
Candida Vaginitis: NEGATIVE
Chlamydia: NEGATIVE
Comment: NEGATIVE
Comment: NEGATIVE
Comment: NEGATIVE
Comment: NEGATIVE
Comment: NEGATIVE
Comment: NORMAL
Neisseria Gonorrhea: NEGATIVE
Trichomonas: NEGATIVE

## 2022-06-10 ENCOUNTER — Ambulatory Visit
Admission: EM | Admit: 2022-06-10 | Discharge: 2022-06-10 | Disposition: A | Discharge status: Home / Self Care | Payer: No Typology Code available for payment source | Attending: Urgent Care | Admitting: Urgent Care

## 2022-06-10 DIAGNOSIS — B354 Tinea corporis: Secondary | ICD-10-CM | POA: Diagnosis not present

## 2022-06-10 DIAGNOSIS — R070 Pain in throat: Secondary | ICD-10-CM | POA: Diagnosis not present

## 2022-06-10 DIAGNOSIS — J020 Streptococcal pharyngitis: Secondary | ICD-10-CM | POA: Diagnosis not present

## 2022-06-10 LAB — POCT RAPID STREP A (OFFICE): Rapid Strep A Screen: POSITIVE — AB

## 2022-06-10 MED ORDER — AMOXICILLIN 875 MG PO TABS: 875.0000 mg | ORAL_TABLET | Freq: Two times a day (BID) | ORAL | 0 refills | Status: DC

## 2022-06-10 MED ORDER — FLUCONAZOLE 150 MG PO TABS: 150.0000 mg | ORAL_TABLET | ORAL | 0 refills | Status: AC

## 2022-06-10 NOTE — ED Provider Notes (Signed)
[facility]  Note:  This document was prepared using Systems analyst and may include unintentional dictation errors.  MRN: [id number] DOB: 1996-02-03  Subjective:   Alexis Copeland is a 27 y.o. female presenting for 2 to 3-day history of acute onset body pains, throat pain, painful swallowing, bilateral ear pain and coughing.  Has used ibuprofen and allergy medication with minimal relief.  No chest pain, shortness of breath or wheezing.  No history of asthma.  No current facility-administered medications for this encounter.  Current Outpatient Medications:    clotrimazole (GYNE-LOTRIMIN) 1 % vaginal cream, Apply to external vaginal area 3-4 times daily as needed for vulvovaginitis., Disp: 45 g, Rfl: 0   fluticasone (FLONASE) 50 MCG/ACT nasal spray, Place 1 spray into both nostrils daily. Begin by using 2 sprays in each nare daily for 3 to 5 days, then decrease to 1 spray in each nare daily., Disp: 15.8 mL, Rfl: 2   levocetirizine (XYZAL) 5 MG tablet, Take 1 tablet (5 mg total) by mouth every evening., Disp: 90 tablet, Rfl: 1   No Known Allergies  History reviewed. No pertinent past medical history.   History reviewed. No pertinent surgical history.  History reviewed. No pertinent family history.  Social History   Tobacco Use   Smoking status: Never   Smokeless tobacco: Never  Vaping Use   Vaping Use: Never used  Substance Use Topics   Alcohol use: No   Drug use: No    ROS   Objective:   Vitals: BP 123/83 (BP Location: Right Arm)   Pulse (!) 122   Temp 99.4 F (37.4 C) (Oral)   Resp 18   LMP 05/22/2022   SpO2 97%   Physical Exam Constitutional:      General: She is not in acute distress.    Appearance: Normal appearance. She is well-developed and normal weight. She is not ill-appearing, toxic-appearing or diaphoretic.  HENT:     Head: Normocephalic and atraumatic.     Right Ear: Tympanic membrane, ear canal and external  ear normal. No drainage, swelling or tenderness. No middle ear effusion. There is no impacted cerumen. Tympanic membrane is not erythematous or bulging.     Left Ear: Tympanic membrane, ear canal and external ear normal. No drainage, swelling or tenderness.  No middle ear effusion. There is no impacted cerumen. Tympanic membrane is not erythematous or bulging.     Nose: Nose normal. No congestion or rhinorrhea.     Mouth/Throat:     Mouth: Mucous membranes are moist. No oral lesions.     Pharynx: Pharyngeal swelling and posterior oropharyngeal erythema present. No oropharyngeal exudate or uvula swelling.     Tonsils: No tonsillar exudate or tonsillar abscesses.  Eyes:     General: No scleral icterus.       Right eye: No discharge.        Left eye: No discharge.     Extraocular Movements: Extraocular movements intact.     Right eye: Normal extraocular motion.     Left eye: Normal extraocular motion.     Conjunctiva/sclera: Conjunctivae normal.  Cardiovascular:     Rate and Rhythm: Normal rate and regular rhythm.     Heart sounds: Normal heart sounds. No murmur heard.    No friction rub. No gallop.  Pulmonary:     Effort: Pulmonary effort is normal. No respiratory distress.     Breath sounds: No stridor. No wheezing, rhonchi or rales.  Chest:  Chest wall: No tenderness.  Musculoskeletal:     Cervical back: Normal range of motion and neck supple.  Lymphadenopathy:     Cervical: No cervical adenopathy.  Skin:    General: Skin is warm and dry.     Findings: Rash (multiple annular lesions with central clearing of varying sizes overlying the neck) present.  Neurological:     General: No focal deficit present.     Mental Status: She is alert and oriented to person, place, and time.  Psychiatric:        Mood and Affect: Mood normal.        Behavior: Behavior normal.    Results for orders placed or performed during the hospital encounter of 06/10/22 (from the past 24 hour(s))  POCT  rapid strep A     Status: Abnormal   Collection Time: 06/10/22  8:29 AM  Result Value Ref Range   Rapid Strep A Screen Positive (A) Negative    Assessment and Plan :   PDMP not reviewed this encounter.  1. Acute streptococcal pharyngitis   2. Throat pain   3. Tinea corporis     Will treat for strep pharyngitis.  Patient is to start amoxicillin, use supportive care otherwise.  Recommended oral fluconazole for tinea corporis of the posterior neck.  Counseled patient on potential for adverse effects with medications prescribed/recommended today, ER and return-to-clinic precautions discussed, patient verbalized understanding.    Jaynee Eagles, [place] 06/10/22 [phone]

## 2022-06-10 NOTE — ED Triage Notes (Signed)
Pt states Tuesday she began having sore throat, body aches, bilateral ear pain and cough.  Home interventions: ibuprofen

## 2022-07-04 DIAGNOSIS — Z7251 High risk heterosexual behavior: Secondary | ICD-10-CM | POA: Diagnosis not present

## 2022-07-04 DIAGNOSIS — N7689 Other specified inflammation of vagina and vulva: Secondary | ICD-10-CM | POA: Diagnosis not present

## 2022-08-04 DIAGNOSIS — Z13220 Encounter for screening for lipoid disorders: Secondary | ICD-10-CM | POA: Diagnosis not present

## 2022-08-04 DIAGNOSIS — Z0000 Encounter for general adult medical examination without abnormal findings: Secondary | ICD-10-CM | POA: Diagnosis not present

## 2022-08-04 DIAGNOSIS — Z1329 Encounter for screening for other suspected endocrine disorder: Secondary | ICD-10-CM | POA: Diagnosis not present

## 2022-08-04 DIAGNOSIS — Z131 Encounter for screening for diabetes mellitus: Secondary | ICD-10-CM | POA: Diagnosis not present

## 2022-08-04 DIAGNOSIS — Z130 Encounter for screening for diseases of the blood and blood-forming organs and certain disorders involving the immune mechanism: Secondary | ICD-10-CM | POA: Diagnosis not present

## 2022-08-04 DIAGNOSIS — Z01419 Encounter for gynecological examination (general) (routine) without abnormal findings: Secondary | ICD-10-CM | POA: Diagnosis not present

## 2023-03-18 ENCOUNTER — Encounter: Payer: Self-pay | Admitting: Emergency Medicine

## 2023-03-18 ENCOUNTER — Ambulatory Visit
Admission: EM | Admit: 2023-03-18 | Discharge: 2023-03-18 | Disposition: A | Discharge status: Home / Self Care | Payer: BC Managed Care – PPO | Attending: Internal Medicine | Admitting: Internal Medicine

## 2023-03-18 DIAGNOSIS — J020 Streptococcal pharyngitis: Secondary | ICD-10-CM

## 2023-03-18 LAB — POCT RAPID STREP A (OFFICE): Rapid Strep A Screen: POSITIVE — AB

## 2023-03-18 MED ORDER — ACETAMINOPHEN 325 MG PO TABS
650.0000 mg | ORAL_TABLET | Freq: Once | ORAL | Status: AC
  Administered 2023-03-18: 650 mg via ORAL

## 2023-03-18 MED ORDER — AMOXICILLIN 500 MG PO CAPS: 500.0000 mg | ORAL_CAPSULE | Freq: Two times a day (BID) | ORAL | 0 refills | Status: AC

## 2023-03-18 NOTE — ED Triage Notes (Signed)
Patient c/o fever, body aches and chills, bilateral ear pain, swollen tonsils since last night.  Patient has taken Alka Seltzer and Ibuprofen.

## 2023-03-18 NOTE — ED Provider Notes (Signed)
[facility]    CSN: [id number] Arrival date & time: 03/18/23  1718      History   Chief Complaint Chief Complaint  Patient presents with   Fever    HPI Alexis Copeland is a 27 y.o. female  presents for evaluation of URI symptoms for 1 days. Patient reports associated symptoms of sore throat, fever, ear pain, body aches. Denies N/V/D, cough, congestion, shortness of breath. Patient does not have a hx of asthma. Patient not not an active smoker.   Reports no sick contacts.  Pt has taken ibuprofen and Alka-Seltzer OTC for symptoms. Pt has no other concerns at this time.    Fever Associated symptoms: myalgias and sore throat     History reviewed. No pertinent past medical history.  There are no active problems to display for this patient.   History reviewed. No pertinent surgical history.  OB History   No obstetric history on file.      Home Medications    Prior to Admission medications   Medication Sig Start Date End Date Taking? Authorizing Provider  amoxicillin (AMOXIL) 500 MG capsule Take 1 capsule (500 mg total) by mouth 2 (two) times daily for 10 days. 03/18/23 03/28/23 Yes Radford Pax, NP  clotrimazole (GYNE-LOTRIMIN) 1 % vaginal cream Apply to external vaginal area 3-4 times daily as needed for vulvovaginitis. 04/02/22   Theadora Rama Scales, PA-C  fluconazole (DIFLUCAN) 150 MG tablet Take 1 tablet (150 mg total) by mouth once a week. 06/10/22   Wallis Bamberg, PA-C  fluticasone (FLONASE) 50 MCG/ACT nasal spray Place 1 spray into both nostrils daily. Begin by using 2 sprays in each nare daily for 3 to 5 days, then decrease to 1 spray in each nare daily. 11/19/21   Theadora Rama Scales, PA-C  levocetirizine (XYZAL) 5 MG tablet Take 1 tablet (5 mg total) by mouth every evening. 11/19/21 05/18/22  Theadora Rama Scales, PA-C    Family History Family History  Problem Relation Age of Onset   Healthy Mother    Healthy Father     Social History Social  History   Tobacco Use   Smoking status: Never   Smokeless tobacco: Never  Vaping Use   Vaping status: Never Used  Substance Use Topics   Alcohol use: No   Drug use: No     Allergies   Patient has no known allergies.   Review of Systems Review of Systems  Constitutional:  Positive for fever.  HENT:  Positive for sore throat.   Musculoskeletal:  Positive for myalgias.     Physical Exam Triage Vital Signs ED Triage Vitals  Encounter Vitals Group     BP 03/18/23 1850 114/71     Systolic BP Percentile --      Diastolic BP Percentile --      Pulse Rate 03/18/23 1850 (!) 124     Resp 03/18/23 1850 18     Temp 03/18/23 1850 (!) 102.7 F (39.3 C)     Temp Source 03/18/23 1850 Oral     SpO2 03/18/23 1850 97 %     Weight 03/18/23 1852 215 lb (97.5 kg)     Height 03/18/23 1852 5\' 5"  (1.651 m)     Head Circumference --      Peak Flow --      Pain Score 03/18/23 1851 10     Pain Loc --      Pain Education --      Exclude from Growth Chart --  No data found.  Updated Vital Signs BP 114/71 (BP Location: Right Arm)   Pulse (!) 124   Temp (!) 102.7 F (39.3 C) (Oral)   Resp 18   Ht 5\' 5"  (1.651 m)   Wt 215 lb (97.5 kg)   LMP 02/17/2023 (Exact Date)   SpO2 97%   BMI 35.78 kg/m   Visual Acuity Right Eye Distance:   Left Eye Distance:   Bilateral Distance:    Right Eye Near:   Left Eye Near:    Bilateral Near:     Physical Exam Vitals and nursing note reviewed.  Constitutional:      General: She is not in acute distress.    Appearance: She is well-developed. She is not ill-appearing.  HENT:     Head: Normocephalic and atraumatic.     Right Ear: Tympanic membrane and ear canal normal.     Left Ear: Tympanic membrane and ear canal normal.     Nose: No congestion or rhinorrhea.     Mouth/Throat:     Mouth: Mucous membranes are moist.     Pharynx: Oropharynx is clear. Uvula midline. Posterior oropharyngeal erythema present.     Tonsils: Tonsillar exudate  present. No tonsillar abscesses. 3+ on the right. 3+ on the left.  Eyes:     Conjunctiva/sclera: Conjunctivae normal.     Pupils: Pupils are equal, round, and reactive to light.  Cardiovascular:     Rate and Rhythm: Normal rate and regular rhythm.     Heart sounds: Normal heart sounds.  Pulmonary:     Effort: Pulmonary effort is normal.     Breath sounds: Normal breath sounds.  Musculoskeletal:     Cervical back: Normal range of motion and neck supple.  Lymphadenopathy:     Cervical: Cervical adenopathy present.  Skin:    General: Skin is warm and dry.  Neurological:     General: No focal deficit present.     Mental Status: She is alert and oriented to person, place, and time.  Psychiatric:        Mood and Affect: Mood normal.        Behavior: Behavior normal.      UC Treatments / Results  Labs (all labs ordered are listed, but only abnormal results are displayed) Labs Reviewed  POCT RAPID STREP A (OFFICE) - Abnormal; Notable for the following components:      Result Value   Rapid Strep A Screen Positive (*)    All other components within normal limits    EKG   Radiology No results found.  Procedures Procedures (including critical care time)  Medications Ordered in UC Medications  acetaminophen (TYLENOL) tablet 650 mg (650 mg Oral Given 03/18/23 1856)    Initial Impression / Assessment and Plan / UC Course  I have reviewed the triage vital signs and the nursing notes.  Pertinent labs & imaging results that were available during my care of the patient were reviewed by me and considered in my medical decision making (see chart for details).     Reviewed exam and symptoms with patient.  Positive rapid strep.  Start amoxicillin twice daily for 10 days.  Patient was given Tylenol in clinic for fever.  Discussed OTC analgesics/fever reducing medications, salt water gargles and warm liquids.  PCP follow-up as symptoms do not improve.  ER precautions reviewed. Final  Clinical Impressions(s) / UC Diagnoses   Final diagnoses:  Streptococcal sore throat     Discharge Instructions  Start amoxicillin twice daily for 10 days.  You are given Tylenol in the clinic for your fever and you may continue this over-the-counter as well as ibuprofen as needed to help manage your fever and throat pain.  Salt water gargles and warm liquids such as teas and honey.  Please follow-up with your PCP if your symptoms do not improve.  Please go to the ER if you develop any worsening symptoms.  I hope you feel better soon!     ED Prescriptions     Medication Sig Dispense Auth. Provider   amoxicillin (AMOXIL) 500 MG capsule Take 1 capsule (500 mg total) by mouth 2 (two) times daily for 10 days. 20 capsule Radford Pax, NP      PDMP not reviewed this encounter.   Radford Pax, NP 03/18/23 1919

## 2023-03-18 NOTE — Discharge Instructions (Signed)
Start amoxicillin twice daily for 10 days.  You are given Tylenol in the clinic for your fever and you may continue this over-the-counter as well as ibuprofen as needed to help manage your fever and throat pain.  Salt water gargles and warm liquids such as teas and honey.  Please follow-up with your PCP if your symptoms do not improve.  Please go to the ER if you develop any worsening symptoms.  I hope you feel better soon!
